# Patient Record
Sex: Female | Born: 2005 | Race: White | Hispanic: No | Marital: Single | Smoking: Never smoker
Health system: Southern US, Community
[De-identification: ages and names within clinical notes are randomized; demographics above are authoritative.]

## PROBLEM LIST (undated history)

## (undated) DIAGNOSIS — R002 Palpitations: Secondary | ICD-10-CM

## (undated) DIAGNOSIS — F419 Anxiety disorder, unspecified: Secondary | ICD-10-CM

## (undated) HISTORY — DX: Anxiety disorder, unspecified: F41.9

## (undated) HISTORY — DX: Palpitations: R00.2

---
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
## 2023-04-09 DIAGNOSIS — R300 Dysuria: Secondary | ICD-10-CM | POA: Diagnosis not present

## 2023-06-20 DIAGNOSIS — M6289 Other specified disorders of muscle: Secondary | ICD-10-CM | POA: Diagnosis not present

## 2023-06-20 DIAGNOSIS — Z87440 Personal history of urinary (tract) infections: Secondary | ICD-10-CM | POA: Diagnosis not present

## 2023-07-08 DIAGNOSIS — M6289 Other specified disorders of muscle: Secondary | ICD-10-CM | POA: Diagnosis not present

## 2023-07-29 DIAGNOSIS — M6289 Other specified disorders of muscle: Secondary | ICD-10-CM | POA: Diagnosis not present

## 2023-07-29 DIAGNOSIS — R3989 Other symptoms and signs involving the genitourinary system: Secondary | ICD-10-CM | POA: Diagnosis not present

## 2023-08-05 DIAGNOSIS — R3989 Other symptoms and signs involving the genitourinary system: Secondary | ICD-10-CM | POA: Diagnosis not present

## 2023-08-05 DIAGNOSIS — M6289 Other specified disorders of muscle: Secondary | ICD-10-CM | POA: Diagnosis not present

## 2023-08-13 DIAGNOSIS — M6289 Other specified disorders of muscle: Secondary | ICD-10-CM | POA: Diagnosis not present

## 2023-08-13 DIAGNOSIS — R3989 Other symptoms and signs involving the genitourinary system: Secondary | ICD-10-CM | POA: Diagnosis not present

## 2023-09-02 DIAGNOSIS — M6289 Other specified disorders of muscle: Secondary | ICD-10-CM | POA: Diagnosis not present

## 2023-09-02 DIAGNOSIS — R3989 Other symptoms and signs involving the genitourinary system: Secondary | ICD-10-CM | POA: Diagnosis not present

## 2023-09-16 DIAGNOSIS — M6289 Other specified disorders of muscle: Secondary | ICD-10-CM | POA: Diagnosis not present

## 2023-09-16 DIAGNOSIS — R3989 Other symptoms and signs involving the genitourinary system: Secondary | ICD-10-CM | POA: Diagnosis not present

## 2023-09-26 DIAGNOSIS — Z789 Other specified health status: Secondary | ICD-10-CM | POA: Diagnosis not present

## 2023-09-26 DIAGNOSIS — R4689 Other symptoms and signs involving appearance and behavior: Secondary | ICD-10-CM | POA: Diagnosis not present

## 2023-10-17 DIAGNOSIS — R002 Palpitations: Secondary | ICD-10-CM | POA: Diagnosis not present

## 2023-10-21 DIAGNOSIS — D649 Anemia, unspecified: Secondary | ICD-10-CM | POA: Diagnosis not present

## 2023-10-23 DIAGNOSIS — R002 Palpitations: Secondary | ICD-10-CM | POA: Diagnosis not present

## 2023-11-12 DIAGNOSIS — R002 Palpitations: Secondary | ICD-10-CM | POA: Diagnosis not present

## 2023-12-06 ENCOUNTER — Ambulatory Visit

## 2023-12-06 VITALS — BP 125/83 | HR 103 | Ht 69.0 in | Wt 241.2 lb

## 2023-12-06 DIAGNOSIS — M542 Cervicalgia: Secondary | ICD-10-CM | POA: Diagnosis not present

## 2023-12-06 DIAGNOSIS — R21 Rash and other nonspecific skin eruption: Secondary | ICD-10-CM

## 2023-12-06 DIAGNOSIS — Z6835 Body mass index (BMI) 35.0-35.9, adult: Secondary | ICD-10-CM

## 2023-12-06 DIAGNOSIS — N62 Hypertrophy of breast: Secondary | ICD-10-CM

## 2023-12-06 DIAGNOSIS — M549 Dorsalgia, unspecified: Secondary | ICD-10-CM | POA: Diagnosis not present

## 2023-12-06 NOTE — Progress Notes (Signed)
 BREAST REDUCTION CONSULT Plastic & Reconstructive Surgery New Patient Visit  Patient: Alexis Roth MRN: [id number] Date: 12/06/2023 Referring Physician: Patient, No Pcp Per  Chief Complaint: Symptomatic macromastia, cervicalgia  History of Present Illness:  This is a 18 y.o. woman with PMH and PSH as described below who presents in consultation for breast reduction.   The patient states she has been considering a breast reduction for years. She describes intermittent skin irritation in the breast folds and occasional rashes.   Back pain in the upper and lower back, including neck pain. She pulls or pins her bra straps to provide better lift and relief of the pressure and pain. She notices relief by holding her breast up manually.  Her shoulder straps cause grooves and pain and pressure that requires padding for relief. Pain medication is sometimes required with motrin and tylenol.  Activities that are hindered by enlarged breasts include: exercise and running.  She has tried supportive clothing as well as fitted bras without improvement.  She currently wears a G cup bra and would ideally like to be a C-D cup.   She has no personal history of breast abnormalities and has never had any breast biopsies or surgeries. Has not had a mammogram due to age.   She no recent weight changes.   Past Medical History: No past medical history on file. Past Surgical History: No past surgical history on file. Current Medications: Current Outpatient Medications on File Prior to Visit  Medication Sig Dispense Refill   phenazopyridine  (PYRIDIUM ) 200 MG tablet Take 1 tablet (200 mg total) by mouth 3 (three) times daily. 6 tablet 0   No current facility-administered medications on file prior to visit.   Allergies: No Known Allergies  Family History:  Denies history of breast cancer in family. Family history is negative for bleeding/clotting disorders, problems with anesthesia, connective tissue  disorders.   Social History:  Social History   Socioeconomic History   Marital status: Single    Spouse name: Not on file   Number of children: Not on file   Years of education: Not on file   Highest education level: Not on file  Occupational History   Not on file  Tobacco Use   Smoking status: Never    Passive exposure: Yes   Smokeless tobacco: Current  Substance and Sexual Activity   Alcohol use: Not on file   Drug use: Not on file   Sexual activity: Not on file  Other Topics Concern   Not on file  Social History Narrative   Not on file   Social Drivers of Health   Financial Resource Strain: Not on file  Food Insecurity: No Food Insecurity (09/26/2023)   [facility]   Hunger Vital Sign    Within the past 12 months, you worried that your food would run out before you got the money to buy more.: Never true    Within the past 12 months, the food you bought just didn't last and you didn't have money to get more.: Never true  Transportation Needs: No Transportation Needs (09/26/2023)   Received from [facility]   PRAPARE - Transportation    Lack of Transportation (Medical): No    Lack of Transportation (Non-Medical): No  Physical Activity: Not on file  Stress: Not on file  Social Connections: Not on file   Smoker: Denies Recreational drug use: Denies  Review of systems: 10 point review of systems performed and negative except as noted in  the HPI  Physical Exam: BP 125/83 (BP Location: Left Arm, Patient Position: Sitting, Cuff Size: Normal)   Pulse (!) 103   Ht 5' 9 (1.753 m)   Wt 241 lb 3.2 oz (109.4 kg)   SpO2 100%   BMI 35.62 kg/m  Body mass index is 35.62 kg/m.  Physical Exam           General: Well appearing, no apparent distress. Chest: Chest wall without abnormality or obvious deformity. No scoliosis, pectus excavatum or pectus carinatum. 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 Breast parenchyma is  fibrous/fatty. There are no palpable breast masses in either breast. Bilateral NAC viable and sensate. Papules everted without discharge. NACs positioned along breast meridian bilaterally. Skin quality is poor. There are no skin lesions, scars or dimpling.  - Breast measurements:  Measurements  Right Breast (cm)  Left Breast (cm)  SN-Nipple 41 43  Base Width 19 19  Nipple - IMF 22 20  NAC diameter 9*10 8*9  Internipple Distance 29  Neuro: Moving all four extremities spontaneously.  Psych: Appropriate mood and affect.   Pertinent Labs: No results found for this or any previous visit (from the past 2160 hours).  Pertinent Imaging:  Assessment: In summary, this is a pleasant 18 y.o. year-old woman presenting for consultation for bilateral breast reduction in setting of symptomatic macromastia.   The patient is not currently a candidate for the procedure as her BSA is > 2 and is planning on significant weight loss.   We discussed that the patient would only be a candidate for breast reduction surgery should she maintain a stable weight >15months and that will help lower the BSA. The patient voiced understanding throughout our discussion today. All questions and concerns were addressed to the patient's apparent satisfaction.   Plan: - Healthy weigh and wellness consult - Follow up with me in 6 months   The sensitive parts of the examination/procedure were performed with MA as chaperone.  The time documented represents the total time spent on the day of the encounter in preparing for and completing the visit. It does not include time spent by ancillary staff, a resident, a fellow, another trainee, or, for shared visits, time spent jointly with the patient or discussing the case or the performance of other separately performed services.  Time spent: 45 minutes.     Kasee Hantz, MD [facility]  12/06/2023 2:36 PM '

## 2023-12-19 DIAGNOSIS — Z0289 Encounter for other administrative examinations: Secondary | ICD-10-CM

## 2023-12-23 ENCOUNTER — Ambulatory Visit (INDEPENDENT_AMBULATORY_CARE_PROVIDER_SITE_OTHER): Admitting: Adult Health

## 2023-12-23 ENCOUNTER — Encounter (INDEPENDENT_AMBULATORY_CARE_PROVIDER_SITE_OTHER): Payer: Self-pay | Admitting: Adult Health

## 2023-12-23 VITALS — BP 114/72 | HR 88 | Temp 98.1°F | Ht 69.0 in | Wt 242.0 lb

## 2023-12-23 DIAGNOSIS — Z6835 Body mass index (BMI) 35.0-35.9, adult: Secondary | ICD-10-CM

## 2023-12-23 DIAGNOSIS — R002 Palpitations: Secondary | ICD-10-CM | POA: Diagnosis not present

## 2023-12-23 DIAGNOSIS — E669 Obesity, unspecified: Secondary | ICD-10-CM

## 2023-12-23 DIAGNOSIS — Z0000 Encounter for general adult medical examination without abnormal findings: Secondary | ICD-10-CM

## 2023-12-23 NOTE — Progress Notes (Signed)
 Office: [phone]  /  Fax: [phone]   Initial Visit    Alexis Roth was seen in clinic today to evaluate for obesity. She is interested in losing weight to improve overall health and reduce the risk of weight related complications. She presents today to review program treatment options, initial physical assessment, and evaluation.     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 Due to school and home addresses, recommend that she establish with [facility]. Also consider utilizing MyChart Video Visits to ensure continue/regular follow-ups.  When asked what else they would like to accomplish? She states: Adopt a healthier eating pattern and lifestyle, Improve energy levels and physical activity, Improve existing medical conditions, and Improve quality of life  When asked how has your weight affected you? She states: Having fatigue and Having poor endurance  Weight history: She reports weight gain since   Highest weight: 242 lbs  Some associated conditions: None  Contributing factors: moderate to high levels of stress, reduced physical activity, and hectic pace of life  Weight promoting medications identified: None  Prior weight loss attempts: Balanced Plate / Portion Control  Current nutrition plan: Portion control / smart choices  Current level of physical activity: Other: Pickle Ball 3 X Week  Current or previous pharmacotherapy: None  Response to medication: Never tried medications   Past medical history includes:  History reviewed. No pertinent past medical history.   Objective    BP 114/72   Pulse 88   Temp 98.1 F (36.7 C)   Ht 5' 9 (1.753 m)   Wt 242 lb (109.8 kg)   SpO2 100%   BMI 35.74 kg/m  She was weighed on the bioimpedance scale: Body mass index is 35.74 kg/m.  Body Fat%:43.3, Visceral Fat Rating:8, Weight trend over the last 12 months:  Increasing  General:  Alert, oriented and cooperative. Patient is in no acute distress.  Respiratory: Normal respiratory effort, no problems with respiration noted   Gait: able to ambulate independently  Mental Status: Normal mood and affect. Normal behavior. Normal judgment and thought content.   DIAGNOSTIC DATA REVIEWED:  BMET No results found for: NA, K, CL, CO2, GLUCOSE, BUN, CREATININE, CALCIUM, GFRNONAA, GFRAA No results found for: HGBA1C No results found for: INSULIN CBC No results found for: WBC, RBC, HGB, HCT, PLT, MCV, MCH, MCHC, RDW Iron/TIBC/Ferritin/ %Sat No results found for: IRON, TIBC, FERRITIN, IRONPCTSAT Lipid Panel  No results found for: CHOL, TRIG, HDL, CHOLHDL, VLDL, LDLCALC, LDLDIRECT Hepatic Function Panel  No results found for: PROT, ALBUMIN, AST, ALT, ALKPHOS, BILITOT, BILIDIR, IBILI No results found for: TSH   Assessment and Plan   Healthcare maintenance  Palpitations  Obesity (BMI 30-39.9), STARTING BMI 35.8   Assessment and Plan          ESTABLISH WITH [facility]     Obesity Treatment / Action Plan:  Patient will work on garnering support from family and friends to begin weight loss journey. Will work on eliminating or reducing the presence of highly palatable, calorie dense foods in the home. Will complete provided nutritional and psychosocial assessment questionnaire before the next appointment. Will be scheduled for indirect calorimetry to determine resting energy expenditure in a fasting state.  This will allow us  to create a reduced calorie, high-protein meal plan to promote loss of fat mass while preserving muscle mass. Counseled on the health benefits of losing 5%-15% of total body weight. Was counseled  on nutritional approaches to weight loss and benefits of reducing processed foods and consuming plant-based foods and high quality protein as part of nutritional  weight management. Was counseled on pharmacotherapy and role as an adjunct in weight management.   Obesity Education Performed Today:  She was weighed on the bioimpedance scale and results were discussed and documented in the synopsis.  We discussed obesity as a disease and the importance of a more detailed evaluation of all the factors contributing to the disease.  We discussed the importance of long term lifestyle changes which include nutrition, exercise and behavioral modifications as well as the importance of customizing this to her specific health and social needs.  We discussed the benefits of reaching a healthier weight to alleviate the symptoms of existing conditions and reduce the risks of the biomechanical, metabolic and psychological effects of obesity.  We reviewed the four pillars of obesity medicine and importance of using a multimodal approach.  We reviewed the basic principles in weight management.   Alexis Roth appears to be in the action stage of change and states they are ready to start intensive lifestyle modifications and behavioral modifications.  I have spent 26 minutes in the care of the patient today including: 3 minutes before the visit reviewing and preparing the chart. 20 minutes face-to-face assessing and reviewing listed medical problems as outlined in obesity care plan, providing nutritional and behavioral counseling on topics outlined in the obesity care plan, counseling regarding anti-obesity medication as outlined in obesity care plan, independently interpreting test results and goals of care, as described in assessment and plan, reviewing and discussing biometric information and progress, and Due to school and home addresses, recommend that she establish with [facility] 3 minutes after the visit updating chart and documentation of encounter.  Reviewed by clinician on day of visit: allergies, medications, problem list, medical history, surgical  history, family history, social history, and previous encounter notes pertinent to obesity diagnosis.  Alexis Roth d. Arianis Bowditch, NP-C

## 2024-01-06 ENCOUNTER — Ambulatory Visit (INDEPENDENT_AMBULATORY_CARE_PROVIDER_SITE_OTHER): Admitting: Bariatrics

## 2024-01-06 ENCOUNTER — Encounter: Payer: Self-pay | Admitting: Bariatrics

## 2024-01-06 VITALS — BP 115/80 | HR 96 | Temp 98.3°F | Ht 69.0 in | Wt 242.0 lb

## 2024-01-06 DIAGNOSIS — R7309 Other abnormal glucose: Secondary | ICD-10-CM

## 2024-01-06 DIAGNOSIS — Z6835 Body mass index (BMI) 35.0-35.9, adult: Secondary | ICD-10-CM | POA: Diagnosis not present

## 2024-01-06 DIAGNOSIS — E66812 Obesity, class 2: Secondary | ICD-10-CM | POA: Diagnosis not present

## 2024-01-06 DIAGNOSIS — R0602 Shortness of breath: Secondary | ICD-10-CM | POA: Diagnosis not present

## 2024-01-06 DIAGNOSIS — Z1331 Encounter for screening for depression: Secondary | ICD-10-CM | POA: Diagnosis not present

## 2024-01-06 DIAGNOSIS — R5383 Other fatigue: Secondary | ICD-10-CM

## 2024-01-06 DIAGNOSIS — E559 Vitamin D deficiency, unspecified: Secondary | ICD-10-CM | POA: Diagnosis not present

## 2024-01-06 DIAGNOSIS — Z0000 Encounter for general adult medical examination without abnormal findings: Secondary | ICD-10-CM

## 2024-01-06 NOTE — Progress Notes (Signed)
 At a Glance:  Vitals Temp: 98.3 F (36.8 C) BP: 115/80 Pulse Rate: 96 SpO2: 99 %   Anthropometric Measurements Height: 5' 9 (1.753 m) Weight: 242 lb (109.8 kg) BMI (Calculated): 35.72 Starting Weight: 242lb Peak Weight: 242lb Waist Measurement : 44 inches   Body Composition  Body Fat %: 43 % Fat Mass (lbs): 104.4 lbs Muscle Mass (lbs): 131.4 lbs Total Body Water (lbs): 93.2 lbs Visceral Fat Rating : 8   Other Clinical Data Fasting: Yes Labs: Yes Today's Visit #: 1 Starting Date: 01/06/24 Comments: 1    EKG: Done on 10/17/23.   Indirect Calorimeter:  Resting Metabolic Rate ( RMR):  RMR (actual):  2102 kcal RMR (calculated): 2,005 kcal  The calculated basal metabolic rate is 7,994 thus her basal metabolic rate is better than expected.  Plan:   Indirect calorimeter completed, interpreted and reviewed with patient today and allowed to ask questions.  Discussed the implications for the chosen plan and exercise based on the RMR reading.  Will consider repeating the RMR in the future based on weight loss.    Chief Complaint:  Obesity   Subjective:  Alexis Roth (MR# [id number]) is a 18 y.o. female who presents for evaluation and treatment of obesity and related comorbidities.   Alexis Roth is currently in the action stage of change and ready to dedicate time achieving and maintaining a healthier weight. [name] is interested in becoming our patient and working on intensive lifestyle modifications including (but not limited to) diet and exercise for weight loss.  Alexis Roth has been struggling with her weight. She has been unsuccessful in either losing weight, maintaining weight loss, or reaching her healthy weight goal.  Alexis Roth's habits were reviewed today and are as follows: she thinks her family will eat healthier with her, she has been heavy most of her life, she started gaining weight during and after middle school and gained more during college. ,  she has significant food cravings issues, she snacks frequently in the evenings, she skips meals frequently, and she has problems with excessive hunger.  Current or previous pharmacotherapy: None  Response to medication: Never tried medications  Other Fatigue Alexis Roth admits to daytime somnolence and admits to waking up still tired. Patient has a history of symptoms of daytime fatigue. Alexis Roth generally gets 7 or 8 hours of sleep per night, and states that she has generally restful sleep. Snoring is not present. Apneic episodes are not present. Epworth Sleepiness Score is 3.   Shortness of Breath Addison notes increasing shortness of breath with exercising and seems to be worsening over time with weight gain. She notes getting out of breath sooner with activity than she used to. This has not gotten worse recently. Ted denies shortness of breath at rest or orthopnea.  Depression Screen Alexis Roth's Food and Mood (modified PHQ-9) score was 17 Moderate depression      01/06/2024    8:20 AM  Depression screen PHQ 2/9  Decreased Interest 0  Down, Depressed, Hopeless 0  PHQ - 2 Score 0  Altered sleeping 1  Tired, decreased energy 0  Change in appetite 1  Feeling bad or failure about yourself  1  Trouble concentrating 0  Moving slowly or fidgety/restless 0  Suicidal thoughts 0  PHQ-9 Score 3  Difficult doing work/chores Somewhat difficult     Assessment and Plan:   Other Fatigue Alexis Roth does feel that her weight is causing her energy to be lower than it should be. Fatigue may be related to  obesity, depression or many other causes. Labs will be ordered, and in the meanwhile, Alexis Roth will focus on self care including making healthy food choices, increasing physical activity and focusing on stress reduction.  Shortness of Breath Alexis Roth does not feel that she gets out of breath more easily that she used to when she exercises. Alexis Roth's shortness of breath appears to be obesity  related and exercise induced. She has agreed to work on weight loss and gradually increase exercise to treat her exercise induced shortness of breath. Will continue to monitor closely.  Health Maintenance:   Obesity   Plan: Will do indirect calorimetry, and labs.     Vitamin D Deficiency  She is at risk for vitamin D deficiency due to obesity.  She is on OTC vitamin D. No results found for: VD25OH  Plan: Vitamin D 2,000 IU weekly.  Will check for vitamin D deficiency.   Alexis Roth had a positive depression screening. Depression is commonly associated with obesity and often results in emotional eating behaviors. We will monitor this closely and work on CBT to help improve the non-hunger eating patterns. Referral to Psychology may be required if no improvement is seen as she continues in our clinic.   Elevated glucose:   She is at risk for insulin resistance, prediabetes and diabetes secondary to obesity.  She does deny a family history of diabetes but states that her relatives do have a history of PCOS.  Plan:   Will begin the plan and exercise. Will do a hemoglobin A1c and insulin level today.  Previous labs reviewed today: No recent labs.   Labs done today CMP, Lipids, Insulin, HgbA1c, Vit D, Vit B12, Thyroid Panel, and CBC   Generalized Obesity: BMI (Calculated): 35.72   Alexis Roth is currently in the action stage of change and her goal is to begin weight loss efforts. I recommend Alexis Roth begin the structured treatment plan as follows:  She has agreed to Category 3 Plan  Exercise goals: All adults should avoid inactivity. Some activity is better than none, and adults who participate in any amount of physical activity, gain some health benefits. She is taking a Programmer, Applications class.   Behavioral modification strategies:increasing lean protein intake, increasing vegetables, increase H2O intake, increase high fiber foods, no skipping meals, meal planning and cooking  strategies, keeping healthy foods in the home, avoiding temptations, and planning for success  She was informed of the importance of frequent follow-up visits to maximize her success with intensive lifestyle modifications for her multiple health conditions. She was informed we would discuss her lab results at her next visit unless there is a critical issue that needs to be addressed sooner. Magaby agreed to keep her next visit at the agreed upon time to discuss these results.  Objective:  General: Cooperative, alert, well developed, in no acute distress. HEENT: Conjunctivae and lids unremarkable. Cardiovascular: Regular rhythm.  Lungs: Normal work of breathing. Neurologic: No focal deficits.   No results found for: CREATININE, BUN, NA, K, CL, CO2 No results found for: ALT, AST, GGT, ALKPHOS, BILITOT No results found for: HGBA1C No results found for: INSULIN No results found for: TSH No results found for: CHOL, HDL, LDLCALC, LDLDIRECT, TRIG, CHOLHDL No results found for: WBC, HGB, HCT, MCV, PLT No results found for: IRON, TIBC, FERRITIN  Attestation Statements:  Applicable history such as the following:  allergies, medications, problem list, medical history, surgical history, family history, social history, and previous encounter notes reviewed by clinician on day of visit:  Time spent on visit in care of the patient today including the items listed below was 40 minutes.    20 minutes were spent talking about the history, 20 minutes for face to face counseling implementing the plan, discussing the specifics of how to arrange meals, meal planning, water intake.   I spent face to face time discussing his/her plan, including breakfast, additional breakfast options, lunch, and dinner options, grocery list, and snacks.  I reviewed her indirect calorimetry. I discussed the implications for the diet plan.    Discussed the  bio-impedence test (fat %, muscle mass, and water weight) and allowed the patient to ask questions.   Discussed the following information sheets: Category 3, Grocery List, 100 Calorie Snacks, 200 Calorie Snacks, and Microwave Meals.  I additionally spent time documenting, reviewing, and checking the codes before submitting.   This may have been prepared with the assistance of Engineer, Civil (consulting).  Occasional wrong-word or sound-a-like substitutions may have occurred due to the inherent limitations of voice recognition software.   Clayborne Daring, DO

## 2024-01-07 ENCOUNTER — Encounter: Payer: Self-pay | Admitting: Bariatrics

## 2024-01-07 DIAGNOSIS — E559 Vitamin D deficiency, unspecified: Secondary | ICD-10-CM | POA: Insufficient documentation

## 2024-01-07 LAB — COMPREHENSIVE METABOLIC PANEL WITH GFR
ALT: 11 IU/L (ref 0–32)
AST: 18 IU/L (ref 0–40)
Albumin: 4.2 g/dL (ref 4.0–5.0)
Alkaline Phosphatase: 126 IU/L — ABNORMAL HIGH (ref 42–106)
BUN/Creatinine Ratio: 10 (ref 9–23)
BUN: 7 mg/dL (ref 6–20)
Bilirubin Total: 0.5 mg/dL (ref 0.0–1.2)
CO2: 18 mmol/L — ABNORMAL LOW (ref 20–29)
Calcium: 9.1 mg/dL (ref 8.7–10.2)
Chloride: 102 mmol/L (ref 96–106)
Creatinine, Ser: 0.71 mg/dL (ref 0.57–1.00)
Globulin, Total: 2.7 g/dL (ref 1.5–4.5)
Glucose: 95 mg/dL (ref 70–99)
Potassium: 4 mmol/L (ref 3.5–5.2)
Sodium: 137 mmol/L (ref 134–144)
Total Protein: 6.9 g/dL (ref 6.0–8.5)
eGFR: 125 mL/min/1.73 (ref >59)

## 2024-01-07 LAB — CBC WITH DIFFERENTIAL/PLATELET
Basophils Absolute: 0.1 x10E3/uL (ref 0.0–0.2)
Basos: 0 %
EOS (ABSOLUTE): 0 x10E3/uL (ref 0.0–0.4)
Eos: 0 %
Hematocrit: 37.9 % (ref 34.0–46.6)
Hemoglobin: 12.3 g/dL (ref 11.1–15.9)
Immature Grans (Abs): 0 x10E3/uL (ref 0.0–0.1)
Immature Granulocytes: 0 %
Lymphocytes Absolute: 1.4 x10E3/uL (ref 0.7–3.1)
Lymphs: 13 %
MCH: 28.7 pg (ref 26.6–33.0)
MCHC: 32.5 g/dL (ref 31.5–35.7)
MCV: 88 fL (ref 79–97)
Monocytes Absolute: 0.6 x10E3/uL (ref 0.1–0.9)
Monocytes: 5 %
Neutrophils Absolute: 9.2 x10E3/uL — ABNORMAL HIGH (ref 1.4–7.0)
Neutrophils: 82 %
Platelets: 259 x10E3/uL (ref 150–450)
RBC: 4.29 x10E6/uL (ref 3.77–5.28)
RDW: 13.4 % (ref 11.7–15.4)
WBC: 11.3 x10E3/uL — ABNORMAL HIGH (ref 3.4–10.8)

## 2024-01-07 LAB — LIPID PANEL WITH LDL/HDL RATIO
Cholesterol, Total: 162 mg/dL (ref 100–199)
HDL: 52 mg/dL (ref >39)
LDL Chol Calc (NIH): 99 mg/dL (ref 0–99)
LDL/HDL Ratio: 1.9 ratio (ref 0.0–3.2)
Triglycerides: 51 mg/dL (ref 0–149)
VLDL Cholesterol Cal: 11 mg/dL (ref 5–40)

## 2024-01-07 LAB — TSH+T4F+T3FREE
Free T4: 0.8 ng/dL — ABNORMAL LOW (ref 0.82–1.77)
T3, Free: 2.2 pg/mL (ref 2.0–4.4)
TSH: 1.23 u[IU]/mL (ref 0.450–4.500)

## 2024-01-07 LAB — VITAMIN D 25 HYDROXY (VIT D DEFICIENCY, FRACTURES): Vit D, 25-Hydroxy: 25.8 ng/mL — ABNORMAL LOW (ref 30.0–100.0)

## 2024-01-07 LAB — HEMOGLOBIN A1C
Est. average glucose Bld gHb Est-mCnc: 85 mg/dL
Hgb A1c MFr Bld: 4.6 % — ABNORMAL LOW (ref 4.8–5.6)

## 2024-01-07 LAB — VITAMIN B12: Vitamin B-12: 430 pg/mL (ref 232–1245)

## 2024-01-07 LAB — INSULIN, RANDOM: INSULIN: 15.8 u[IU]/mL (ref 2.6–24.9)

## 2024-01-08 DIAGNOSIS — J069 Acute upper respiratory infection, unspecified: Secondary | ICD-10-CM | POA: Diagnosis not present

## 2024-01-20 ENCOUNTER — Encounter: Payer: Self-pay | Admitting: Bariatrics

## 2024-01-20 ENCOUNTER — Ambulatory Visit: Admitting: Bariatrics

## 2024-01-20 VITALS — BP 110/78 | HR 66 | Temp 98.1°F | Ht 69.0 in | Wt 237.0 lb

## 2024-01-20 DIAGNOSIS — E559 Vitamin D deficiency, unspecified: Secondary | ICD-10-CM | POA: Diagnosis not present

## 2024-01-20 DIAGNOSIS — E669 Obesity, unspecified: Secondary | ICD-10-CM | POA: Diagnosis not present

## 2024-01-20 DIAGNOSIS — R002 Palpitations: Secondary | ICD-10-CM | POA: Diagnosis not present

## 2024-01-20 DIAGNOSIS — Z6834 Body mass index (BMI) 34.0-34.9, adult: Secondary | ICD-10-CM | POA: Diagnosis not present

## 2024-01-20 DIAGNOSIS — Z6835 Body mass index (BMI) 35.0-35.9, adult: Secondary | ICD-10-CM

## 2024-01-20 MED ORDER — VITAMIN D (ERGOCALCIFEROL) 1.25 MG (50000 UNIT) PO CAPS: 50000.0000 [IU] | ORAL_CAPSULE | ORAL | 0 refills | Status: DC

## 2024-01-20 NOTE — Progress Notes (Signed)
 First follow-up after initial visit.        WEIGHT SUMMARY AND BIOMETRICS  Weight Lost Since Last Visit: 5lb  Weight Gained Since Last Visit: 0lb   Vitals Temp: 98.1 F (36.7 C) BP: 110/78 Pulse Rate: 66 SpO2: 98 %   Anthropometric Measurements Height: 5' 9 (1.753 m) Weight: 237 lb (107.5 kg) BMI (Calculated): 34.98 Weight at Last Visit: 242lb Weight Lost Since Last Visit: 5lb Weight Gained Since Last Visit: 0lb Starting Weight: 242lb Total Weight Loss (lbs): 5 lb (2.268 kg)   Body Composition  Body Fat %: 42.5 % Fat Mass (lbs): 100.8 lbs Muscle Mass (lbs): 129.8 lbs Total Body Water (lbs): 91.8 lbs Visceral Fat Rating : 7   Other Clinical Data Fasting: No Labs: No Today's Visit #: 2 Starting Date: 01/06/24 Comments: 2    OBESITY Joanne is here to discuss her progress with her obesity treatment plan along with follow-up of her obesity related diagnoses.    Nutrition Plan: the Category 3 plan - 60% adherence.  Current exercise: cardiovascular workout on exercise equipment and pickleball  Interim History:  She is down 5 lbs since her last visit.  Eating all of the food on the plan., Protein intake is as prescribed, and Water intake is adequate.  Initial positives regarding the dietary plan:  Initial challenges regarding  the dietary plan:   Pharmacotherapy: Jeralyn is not on any anti-obesity medications.  Hunger is moderately controlled.  Cravings are moderately controlled.  Assessment/Plan:   Vitamin D  Deficiency Vitamin D  is not at goal of 50.  Most recent vitamin D  level was 25.8. She is on  prescription ergocalciferol  50,000 IU weekly. Lab Results  Component Value Date   VD25OH 25.8 (L) 01/06/2024    Plan: Begin prescription vitamin D  50,000 IU weekly.   Palpitations:   She was having some issues with  palpitations. This has not hampered her exercise. Her PCP is working this up.   Plan: She will follow-up with her PCP.    Generalized Obesity: Current BMI BMI (Calculated): 34.98   Pharmacotherapy Plan No anti-obesity medications.   Dayane is currently in the action stage of change. As such, her goal is to continue with weight loss efforts.  She has agreed to the Category 3 plan.  Exercise goals: All adults should avoid inactivity. Some physical activity is better than none, and adults who participate in any amount of physical activity gain some health benefits. She is doing Pickle ball and cardio.  She will go back to incline walking.   Behavioral modification strategies: increasing lean protein intake, no meal skipping, meal planning , increase water intake, better snacking choices, avoiding temptations, and keep healthy foods in the home.  Gwendlyon has agreed to follow-up with our clinic in 2 to 3 weeks with Corean Dues, NP for exercise education and plan and then back with me in about 5 weeks.   Labs reviewed today from last visit (CMP, Lipids, HgbA1c, insulin , CBC, vitamin D , B 12, and thyroid panel).   Objective:   VITALS: Per patient if applicable, see vitals. GENERAL: Alert and in no acute distress. CARDIOPULMONARY: No increased WOB. Speaking in clear sentences.  PSYCH: Pleasant and cooperative. Speech normal rate and rhythm. Affect is appropriate. Insight and judgement are appropriate. Attention is focused, linear, and appropriate.  NEURO: Oriented as arrived to appointment on time with no prompting.   Attestation Statements:   This was prepared with the assistance of Engineer, Civil (consulting).  Occasional wrong-word or sound-a-like  substitutions may have occurred due to the inherent limitations of voice recognition software.   Clayborne Daring, DO

## 2024-02-03 ENCOUNTER — Telehealth: Admitting: Nurse Practitioner

## 2024-02-03 DIAGNOSIS — E66812 Obesity, class 2: Secondary | ICD-10-CM | POA: Diagnosis not present

## 2024-02-03 DIAGNOSIS — E559 Vitamin D deficiency, unspecified: Secondary | ICD-10-CM

## 2024-02-03 DIAGNOSIS — Z6835 Body mass index (BMI) 35.0-35.9, adult: Secondary | ICD-10-CM | POA: Diagnosis not present

## 2024-02-03 NOTE — Progress Notes (Signed)
 Office: [phone]  /  Fax: [phone]  WEIGHT SUMMARY AND BIOMETRICS  TeleHealth Visit:  This visit was completed with telemedicine (audio/video) technology. Alexis Roth has verbally consented to this TeleHealth visit. The patient is located at her college dorm, the provider is located at [facility] at [place]. The participants in this visit include the listed provider and patient. The visit was conducted today via MyChart video.    HPI  Chief Complaint: OBESITY  Alexis Roth is here via video visit to discuss her progress with her obesity treatment plan.     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  She lives off campus and doesn't generally eat on campus.  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  She struggles most with lunch.  BF:  2 eggs with 2 slices of sarah lee bread and sausage either 2-3 based upon links vs patties, lunch:  sandwich with turkey with mustard with lettuce and 1/2 cup of cottage cheese an an apple, dinner: taco bowl or a variation of that.  She was taking a pickleball class but that has finished for the semester.  She is planning to start going back to the gym and wants to start doing resistance training.     Pharmacotherapy for weight loss: She is not currently taking medications  for medical weight loss.  Denies side effects.    Previous pharmacotherapy for medical weight loss:  none  Bariatric surgery:  Patient has not had bariatric surgery.    Vit D deficiency  She is taking Vit D 50,000 IU weekly.  Denies side effects.  Denies nausea, vomiting or muscle weakness.    Lab Results  Component Value Date   VD25OH 25.8 (L) 01/06/2024      PHYSICAL EXAM:  Last menstrual period 01/17/2024. There is no height or weight on file to calculate BMI.  General: She is overweight,  cooperative, alert, well developed, and in no acute distress. PSYCH: Has normal mood, affect and thought process.   Extremities: No edema.  Neurologic: No gross sensory or motor deficits. No tremors or fasciculations noted.    DIAGNOSTIC DATA REVIEWED:  BMET    Component Value Date/Time   NA 137 01/06/2024 0924   K 4.0 01/06/2024 0924   CL 102 01/06/2024 0924   CO2 18 (L) 01/06/2024 0924   GLUCOSE 95 01/06/2024 0924   BUN 7 01/06/2024 0924   CREATININE 0.71 01/06/2024 0924   CALCIUM 9.1 01/06/2024 0924   Lab Results  Component Value Date   HGBA1C 4.6 (L) 01/06/2024   Lab Results  Component Value Date   INSULIN  15.8 01/06/2024   Lab Results  Component Value Date   TSH 1.230 01/06/2024   CBC    Component Value Date/Time   WBC 11.3 (H) 01/06/2024 0924   RBC 4.29 01/06/2024 0924   HGB 12.3 01/06/2024 0924   HCT 37.9 01/06/2024 0924   PLT 259 01/06/2024 0924   MCV 88 01/06/2024 0924   MCH 28.7 01/06/2024 0924   MCHC 32.5 01/06/2024 0924   RDW 13.4 01/06/2024 0924   Iron Studies No results found for: IRON, TIBC, FERRITIN, IRONPCTSAT Lipid Panel     Component Value Date/Time   CHOL 162 01/06/2024 0924   TRIG 51 01/06/2024 0924   HDL 52 01/06/2024  9075   LDLCALC 99 01/06/2024 0924   Hepatic Function Panel     Component Value Date/Time   PROT 6.9 01/06/2024 0924   ALBUMIN 4.2 01/06/2024 0924   AST 18 01/06/2024 0924   ALT 11 01/06/2024 0924   ALKPHOS 126 (H) 01/06/2024 0924   BILITOT 0.5 01/06/2024 0924      Component Value Date/Time   TSH 1.230 01/06/2024 9075   Nutritional Lab Results  Component Value Date   VD25OH 25.8 (L) 01/06/2024     ASSESSMENT AND PLAN  TREATMENT PLAN FOR OBESITY:  Recommended Dietary Goals  Alexis Roth is currently in the action stage of change. As such, her goal is to continue weight management plan. She has agreed to keeping a food journal and adhering to recommended goals of 1500 calories and 90+ grams  protein.  Behavioral Intervention  We discussed the following Behavioral Modification Strategies today: increasing lean protein intake to established goals, decreasing simple carbohydrates , increasing vegetables, increasing fiber rich foods, increasing water intake , reading food labels , keeping healthy foods at home, celebration eating strategies, continue to work on maintaining a reduced calorie state, getting the recommended amount of protein, incorporating whole foods, making healthy choices, staying well hydrated and practicing mindfulness when eating., and increase protein intake, fibrous foods (25 grams per day for women, 30 grams for men) and water to improve satiety and decrease hunger signals. .  Additional resources provided today: NA  Recommended Physical Activity Goals  Alexis Roth has been advised to work up to 150 minutes of moderate intensity aerobic activity a week and strengthening exercises 2-3 times per week for cardiovascular health, weight loss maintenance and preservation of muscle mass.   She has agreed to Think about enjoyable ways to increase daily physical activity and overcoming barriers to exercise, Increase physical activity in their day and reduce sedentary time (increase NEAT)., Work on scheduling and tracking physical activity. , Increase volume of physical activity to a goal of 240 minutes a week, and Combine aerobic and strengthening exercises for efficiency and improved cardiometabolic health.   ASSOCIATED CONDITIONS ADDRESSED TODAY  Action/Plan  Vitamin D  insufficiency Continue Vit D 50,000 international units  weekly.   Class 2 severe obesity due to excess calories with serious comorbidity and body mass index (BMI) of 35.0 to 35.9 in adult         Return in about 4 weeks (around 03/02/2024).Alexis Roth She was informed of the importance of frequent follow up visits to maximize her success with intensive lifestyle modifications for her multiple health  conditions.   ATTESTASTION STATEMENTS:  Reviewed by clinician on day of visit: allergies, medications, problem list, medical history, surgical history, family history, social history, and previous encounter notes.   I personally spent a total of 35 minutes in the care of the patient today including preparing to see the patient, counseling and educating, documenting clinical information in the EHR, independently interpreting results, and communicating results.    Alexis Roth SAUNDERS. Alexis Payeur FNP-C

## 2024-03-02 ENCOUNTER — Ambulatory Visit (INDEPENDENT_AMBULATORY_CARE_PROVIDER_SITE_OTHER): Admitting: Bariatrics

## 2024-03-02 ENCOUNTER — Encounter: Payer: Self-pay | Admitting: Bariatrics

## 2024-03-02 VITALS — BP 106/68 | HR 79 | Ht 69.0 in | Wt 236.0 lb

## 2024-03-02 DIAGNOSIS — E669 Obesity, unspecified: Secondary | ICD-10-CM

## 2024-03-02 DIAGNOSIS — E559 Vitamin D deficiency, unspecified: Secondary | ICD-10-CM | POA: Diagnosis not present

## 2024-03-02 DIAGNOSIS — Z6834 Body mass index (BMI) 34.0-34.9, adult: Secondary | ICD-10-CM

## 2024-03-02 DIAGNOSIS — R5383 Other fatigue: Secondary | ICD-10-CM

## 2024-03-02 DIAGNOSIS — E6609 Other obesity due to excess calories: Secondary | ICD-10-CM

## 2024-03-02 MED ORDER — VITAMIN D (ERGOCALCIFEROL) 1.25 MG (50000 UNIT) PO CAPS: 50000.0000 [IU] | ORAL_CAPSULE | ORAL | 0 refills | Status: AC

## 2024-03-02 NOTE — Progress Notes (Signed)
 "                                                                                                             WEIGHT SUMMARY AND BIOMETRICS  Weight Lost Since Last Visit: 1lb  Weight Gained Since Last Visit: 0   Vitals BP: 106/68 Pulse Rate: 79 SpO2: 99 %   Anthropometric Measurements Height: 5' 9 (1.753 m) Weight: 236 lb (107 kg) BMI (Calculated): 34.84 Weight at Last Visit: 237lb Weight Lost Since Last Visit: 1lb Weight Gained Since Last Visit: 0 Starting Weight: 242lb Total Weight Loss (lbs): 6 lb (2.722 kg) Peak Weight: 242lb   Body Composition  Body Fat %: 42.3 % Fat Mass (lbs): 99.8 lbs Muscle Mass (lbs): 129.4 lbs Total Body Water (lbs): 89.2 lbs Visceral Fat Rating : 7   Other Clinical Data Fasting: no Labs: no Today's Visit #: 3 Starting Date: 01/06/24    OBESITY Alexis Roth is here to discuss her progress with her obesity treatment plan along with follow-up of her obesity related diagnoses.    Nutrition Plan: the Category 3 plan - 75-80% adherence.  Current exercise: dancing   Interim History:  She is down 1 lb since her last visit.  Not eating all of the food on the plan., Protein intake is as prescribed, Is not skipping meals, and Water intake is adequate.   Pharmacotherapy: Alexis Roth is not on any anti-obesity medications.  Hunger is moderately controlled.  Cravings are moderately controlled.  Assessment/Plan:   Vitamin D  Deficiency Vitamin D  is not at goal of 50.  Most recent vitamin D  level was 25.8. She is on  prescription ergocalciferol  50,000 IU weekly. Lab Results  Component Value Date   VD25OH 25.8 (L) 01/06/2024    Plan: Continue prescription vitamin D  50,000 IU weekly.  She will increase her protein.  She will eat her protein first.  She will weigh her protein.  She will increase her fiber. Fiber sheet selections given and discussed.   Fatigue:   Her energy is improving. She is taking both vitamin D  and B12.    Plan:  Continue both vitamin D  and B 12.  Will continue exercise and add in more resistance.     Generalized Obesity: Current BMI BMI (Calculated): 34.84   Pharmacotherapy Plan No anti-obesity medications.   Alexis Roth is currently in the action stage of change. As such, her goal is to continue with weight loss efforts.  She has agreed to the Category 3 plan.  Exercise goals: For substantial health benefits, adults should do at least 150 minutes (2 hours and 30 minutes) a week of moderate-intensity, or 75 minutes (1 hour and 15 minutes) a week of vigorous-intensity aerobic physical activity, or an equivalent combination of moderate- and vigorous-intensity aerobic activity. Aerobic activity should be performed in episodes of at least 10 minutes, and preferably, it should be spread throughout the week.  Behavioral modification strategies: increasing lean protein intake, no meal skipping, meal planning , increase water intake, better snacking choices, planning for success, increasing vegetables, increasing fiber  rich foods, decrease snacking , avoiding temptations, keep healthy foods in the home, increase frequency of journaling, weigh protein portions, measure portion sizes, work on smaller portions, and mindful eating.  Alexis Roth has agreed to follow-up with our clinic in 4 weeks.    Objective:   VITALS: Per patient if applicable, see vitals. GENERAL: Alert and in no acute distress. CARDIOPULMONARY: No increased WOB. Speaking in clear sentences.  PSYCH: Pleasant and cooperative. Speech normal rate and rhythm. Affect is appropriate. Insight and judgement are appropriate. Attention is focused, linear, and appropriate.  NEURO: Oriented as arrived to appointment on time with no prompting.   Attestation Statements:   This was prepared with the assistance of Engineer, Civil (consulting).  Occasional wrong-word or sound-a-like substitutions may have occurred due to the inherent limitations of  voice recognition.   Clayborne Daring, DO   "

## 2024-04-06 ENCOUNTER — Telehealth: Admitting: Nurse Practitioner

## 2024-05-04 ENCOUNTER — Ambulatory Visit: Admitting: Bariatrics
